# Patient Record
Sex: Male | Born: 1940 | Race: White | Hispanic: No | Marital: Married | Smoking: Never smoker
Health system: Southern US, Community
[De-identification: ages and names within clinical notes are randomized; demographics above are authoritative.]

## PROBLEM LIST (undated history)

## (undated) DIAGNOSIS — G4730 Sleep apnea, unspecified: Secondary | ICD-10-CM

## (undated) DIAGNOSIS — K219 Gastro-esophageal reflux disease without esophagitis: Secondary | ICD-10-CM

## (undated) HISTORY — PX: BACK SURGERY: SHX140

## (undated) HISTORY — PX: EYE SURGERY: SHX253

## (undated) HISTORY — PX: MOUTH SURGERY: SHX715

## (undated) HISTORY — PX: JOINT REPLACEMENT: SHX530

## (undated) HISTORY — PX: VASECTOMY: SHX75

## (undated) HISTORY — PX: HERNIA REPAIR: SHX51

## (undated) HISTORY — PX: TONSILLECTOMY: SUR1361

---

## 2014-05-12 ENCOUNTER — Other Ambulatory Visit: Payer: Self-pay | Admitting: Physician Assistant

## 2014-05-12 NOTE — H&P (Signed)
TOTAL KNEE ADMISSION H&P  Patient is being admitted for left total knee arthroplasty.  Subjective:  Chief Complaint:left knee pain.  HPI: Alan AbideJohn Frederick, 73 y.o. male, has a history of pain and functional disability in the left knee due to arthritis and has failed non-surgical conservative treatments for greater than 12 weeks to includeNSAID's and/or analgesics, corticosteriod injections and activity modification.  Onset of symptoms was gradual, starting >10 years ago with gradually worsening course since that time. The patient noted prior procedures on the knee to include  arthroscopy and menisectomy on the left knee(s).  Patient currently rates pain in the left knee(s) at 5 out of 10 with activity. Patient has night pain, worsening of pain with activity and weight bearing and joint swelling.  Patient has evidence of subchondral cysts, subchondral sclerosis and joint space narrowing by imaging studies. There is no active infection.  There are no active problems to display for this patient.  No past medical history on file.  No past surgical history on file.   (Not in a hospital admission) Allergies not on file  History  Substance Use Topics  . Smoking status: Not on file  . Smokeless tobacco: Not on file  . Alcohol Use: Not on file    No family history on file.   Review of Systems  Constitutional: Negative.   HENT: Negative.   Eyes: Negative.   Respiratory: Negative.   Cardiovascular: Negative.   Gastrointestinal: Negative.   Genitourinary: Positive for frequency. Negative for dysuria, urgency and hematuria.  Musculoskeletal: Positive for joint pain.  Skin: Negative.   Neurological: Negative.   Endo/Heme/Allergies: Negative.   Psychiatric/Behavioral: Negative.     Objective:  Physical Exam  Constitutional: He is oriented to person, place, and time. He appears well-developed and well-nourished.  HENT:  Head: Normocephalic and atraumatic.  Eyes: EOM are normal. Pupils are  equal, round, and reactive to light.  Neck: Normal range of motion. Neck supple.  Cardiovascular: Normal rate and regular rhythm.  Exam reveals no gallop and no friction rub.   No murmur heard. Respiratory: Effort normal. No respiratory distress. He has no wheezes. He has no rales.  GI: Soft. Bowel sounds are normal.  Musculoskeletal:  Examination of his left knee reveals range of motion from 0 to 110 degrees.  He does have an extension lag.  Moderate patellofemoral crepitus noted.  Some tenderness noted over the pes anserine bursa.  No joint line tenderness.  No swelling.  Positive degree of varus.  No positive straight leg raise.  Negative log roll.  Ligaments are stable.  He is neurovascularly intact distally.    Neurological: He is alert and oriented to person, place, and time.  Skin: Skin is warm and dry.  Psychiatric: He has a normal mood and affect. His behavior is normal. Judgment and thought content normal.    Vital signs in last 24 hours: @VSRANGES @  Labs:   There is no height or weight on file to calculate BMI.   Imaging Review Plain radiographs demonstrate severe degenerative joint disease of the left knee(s). The overall alignment ismild varus. The bone quality appears to be fair for age and reported activity level.  Assessment/Plan:  End stage arthritis, left knee   The patient history, physical examination, clinical judgment of the provider and imaging studies are consistent with end stage degenerative joint disease of the left knee(s) and total knee arthroplasty is deemed medically necessary. The treatment options including medical management, injection therapy arthroscopy and arthroplasty were discussed  at length. The risks and benefits of total knee arthroplasty were presented and reviewed. The risks due to aseptic loosening, infection, stiffness, patella tracking problems, thromboembolic complications and other imponderables were discussed. The patient acknowledged the  explanation, agreed to proceed with the plan and consent was signed. Patient is being admitted for inpatient treatment for surgery, pain control, PT, OT, prophylactic antibiotics, VTE prophylaxis, progressive ambulation and ADL's and discharge planning. The patient is planning to be discharged home with home health services   

## 2014-05-17 ENCOUNTER — Encounter (HOSPITAL_COMMUNITY): Payer: Self-pay | Admitting: Pharmacy Technician

## 2014-05-22 ENCOUNTER — Ambulatory Visit (HOSPITAL_COMMUNITY)
Admission: RE | Admit: 2014-05-22 | Discharge: 2014-05-22 | Disposition: A | Discharge status: Home / Self Care | Payer: Medicare Other | Source: Ambulatory Visit | Attending: Physician Assistant | Admitting: Physician Assistant

## 2014-05-22 ENCOUNTER — Encounter (HOSPITAL_COMMUNITY): Payer: Self-pay

## 2014-05-22 ENCOUNTER — Encounter (HOSPITAL_COMMUNITY)
Admission: RE | Admit: 2014-05-22 | Discharge: 2014-05-22 | Disposition: A | Discharge status: Home / Self Care | Payer: Medicare Other | Source: Ambulatory Visit | Attending: Orthopedic Surgery | Admitting: Orthopedic Surgery

## 2014-05-22 DIAGNOSIS — Z01818 Encounter for other preprocedural examination: Secondary | ICD-10-CM | POA: Diagnosis present

## 2014-05-22 DIAGNOSIS — M179 Osteoarthritis of knee, unspecified: Secondary | ICD-10-CM | POA: Diagnosis not present

## 2014-05-22 HISTORY — DX: Sleep apnea, unspecified: G47.30

## 2014-05-22 HISTORY — DX: Gastro-esophageal reflux disease without esophagitis: K21.9

## 2014-05-22 LAB — CBC WITH DIFFERENTIAL/PLATELET
BASOS ABS: 0.1 10*3/uL (ref 0.0–0.1)
BASOS PCT: 1 % (ref 0–1)
EOS ABS: 0.3 10*3/uL (ref 0.0–0.7)
Eosinophils Relative: 4 % (ref 0–5)
HCT: 45.1 % (ref 39.0–52.0)
HEMOGLOBIN: 15.8 g/dL (ref 13.0–17.0)
Lymphocytes Relative: 31 % (ref 12–46)
Lymphs Abs: 2.2 10*3/uL (ref 0.7–4.0)
MCH: 30.4 pg (ref 26.0–34.0)
MCHC: 35 g/dL (ref 30.0–36.0)
MCV: 86.9 fL (ref 78.0–100.0)
Monocytes Absolute: 0.7 10*3/uL (ref 0.1–1.0)
Monocytes Relative: 10 % (ref 3–12)
NEUTROS ABS: 4 10*3/uL (ref 1.7–7.7)
Neutrophils Relative %: 54 % (ref 43–77)
PLATELETS: 200 10*3/uL (ref 150–400)
RBC: 5.19 MIL/uL (ref 4.22–5.81)
RDW: 13.4 % (ref 11.5–15.5)
WBC: 7.3 10*3/uL (ref 4.0–10.5)

## 2014-05-22 LAB — SURGICAL PCR SCREEN
MRSA, PCR: NEGATIVE
Staphylococcus aureus: NEGATIVE

## 2014-05-22 LAB — URINALYSIS, ROUTINE W REFLEX MICROSCOPIC
Bilirubin Urine: NEGATIVE
Glucose, UA: NEGATIVE mg/dL
Hgb urine dipstick: NEGATIVE
Ketones, ur: NEGATIVE mg/dL
Leukocytes, UA: NEGATIVE
NITRITE: NEGATIVE
Protein, ur: NEGATIVE mg/dL
Specific Gravity, Urine: 1.023 (ref 1.005–1.030)
UROBILINOGEN UA: 0.2 mg/dL (ref 0.0–1.0)
pH: 6.5 (ref 5.0–8.0)

## 2014-05-22 LAB — PROTIME-INR
INR: 0.97 (ref 0.00–1.49)
Prothrombin Time: 13 seconds (ref 11.6–15.2)

## 2014-05-22 LAB — COMPREHENSIVE METABOLIC PANEL
ALK PHOS: 92 U/L (ref 39–117)
ALT: 27 U/L (ref 0–53)
AST: 23 U/L (ref 0–37)
Albumin: 3.7 g/dL (ref 3.5–5.2)
Anion gap: 11 (ref 5–15)
BILIRUBIN TOTAL: 1 mg/dL (ref 0.3–1.2)
BUN: 17 mg/dL (ref 6–23)
CHLORIDE: 100 meq/L (ref 96–112)
CO2: 28 mEq/L (ref 19–32)
Calcium: 9.5 mg/dL (ref 8.4–10.5)
Creatinine, Ser: 1.01 mg/dL (ref 0.50–1.35)
GFR calc Af Amer: 84 mL/min — ABNORMAL LOW (ref >90)
GFR calc non Af Amer: 73 mL/min — ABNORMAL LOW (ref >90)
Glucose, Bld: 93 mg/dL (ref 70–99)
POTASSIUM: 4.4 meq/L (ref 3.7–5.3)
SODIUM: 139 meq/L (ref 137–147)
TOTAL PROTEIN: 6.9 g/dL (ref 6.0–8.3)

## 2014-05-22 LAB — TYPE AND SCREEN
ABO/RH(D): A POS
ANTIBODY SCREEN: NEGATIVE

## 2014-05-22 LAB — APTT: aPTT: 31 seconds (ref 24–37)

## 2014-05-22 LAB — ABO/RH: ABO/RH(D): A POS

## 2014-05-22 NOTE — Pre-Procedure Instructions (Signed)
Alan Frederick  05/22/2014   Your procedure is scheduled on:  Wednesday, Oct. 28th   [facility]30 [facility].   Call this number if you have problems the morning of surgery: [phone]   Remember:   Do not eat food or drink liquids after midnight Tuesday.   Take these medicines the morning of surgery with A SIP OF WATER: NONE   Do not wear jewelry, no rings or watches.  Do not wear lotions or colognes.   You may NOT wear deodorant the morning of surgery.   Men may shave face and neck.   Do not bring valuables to the hospital.  [facility] is not responsible for any belongings or valuables.               Contacts, dentures or bridgework may not be worn into surgery.  Leave suitcase in the car. After surgery it may be brought to your room.  For patients admitted to the hospital, discharge time is determined by your treatment team.               Name and phone number of your driver:    Special Instructions: "Preparing for Surgery" instruction sheet.   Please read over the following fact sheets that you were given: Pain Booklet, Coughing and Deep Breathing, Blood Transfusion Information, MRSA Information and Surgical Site Infection Prevention

## 2014-05-22 NOTE — Progress Notes (Addendum)
Had sleep study done around 9 yrs ago at [facility] .  Uses the cpap, but doesn't know the settings.   Denies any cardiac history.  PCP is Dr. Paulino DoorMark Weiser @ [facility]   [phone]  (requested old ekg if done) Pt opted to watch Knee surgery video @ home.

## 2014-05-22 NOTE — Progress Notes (Signed)
Anesthesia Chart Review:  Pt is 73 year old male scheduled for L total knee replacement on 05/31/2014 with Dr. Jamison Neighbor. Murphy.   PMH: OSA, GERD  Medications include: ASA, vitamins  Preoperative labs reviewed.   Chest x-ray reviewed.  No active cardiopulmonary disease.  EKG: sinus bradycardia (57 bpm). Left axis deviation. Cannot rule out anterior infarct, age undetermined. Appears unchanged from previous EKG on chart dated 11/27/2011 from PCP office.   If no changes, I anticipate pt can proceed with surgery as scheduled.   Rica Mastngela Kabbe, FNP-BC [facility]/Anesthesiology Phone: [phone] 05/22/2014 3:36 PM

## 2014-05-30 MED ORDER — CLINDAMYCIN PHOSPHATE 900 MG/50ML IV SOLN
900.0000 mg | INTRAVENOUS | Status: AC
  Administered 2014-05-31: 900 mg via INTRAVENOUS
  Filled 2014-05-30: qty 50

## 2014-05-30 MED ORDER — LACTATED RINGERS IV SOLN: INTRAVENOUS | Status: DC

## 2014-05-31 ENCOUNTER — Inpatient Hospital Stay (HOSPITAL_COMMUNITY)
Admission: RE | Admit: 2014-05-31 | Discharge: 2014-06-01 | DRG: 470 | Disposition: A | Discharge status: Home Health | Payer: Medicare Other | Source: Ambulatory Visit | Attending: Orthopedic Surgery | Admitting: Orthopedic Surgery

## 2014-05-31 ENCOUNTER — Encounter (HOSPITAL_COMMUNITY): Payer: Medicare Other | Admitting: Emergency Medicine

## 2014-05-31 ENCOUNTER — Encounter (HOSPITAL_COMMUNITY): Payer: Self-pay | Admitting: *Deleted

## 2014-05-31 ENCOUNTER — Encounter (HOSPITAL_COMMUNITY)
Admission: RE | Disposition: A | Discharge status: Home Health | Payer: Self-pay | Source: Ambulatory Visit | Attending: Orthopedic Surgery

## 2014-05-31 ENCOUNTER — Inpatient Hospital Stay (HOSPITAL_COMMUNITY): Payer: Medicare Other | Admitting: Anesthesiology

## 2014-05-31 ENCOUNTER — Inpatient Hospital Stay (HOSPITAL_COMMUNITY): Payer: Medicare Other

## 2014-05-31 DIAGNOSIS — M1710 Unilateral primary osteoarthritis, unspecified knee: Secondary | ICD-10-CM | POA: Diagnosis present

## 2014-05-31 DIAGNOSIS — M1712 Unilateral primary osteoarthritis, left knee: Principal | ICD-10-CM | POA: Diagnosis present

## 2014-05-31 DIAGNOSIS — D62 Acute posthemorrhagic anemia: Secondary | ICD-10-CM | POA: Diagnosis not present

## 2014-05-31 DIAGNOSIS — G4730 Sleep apnea, unspecified: Secondary | ICD-10-CM | POA: Diagnosis present

## 2014-05-31 DIAGNOSIS — Z96659 Presence of unspecified artificial knee joint: Secondary | ICD-10-CM

## 2014-05-31 DIAGNOSIS — M179 Osteoarthritis of knee, unspecified: Secondary | ICD-10-CM | POA: Diagnosis present

## 2014-05-31 DIAGNOSIS — K219 Gastro-esophageal reflux disease without esophagitis: Secondary | ICD-10-CM | POA: Diagnosis present

## 2014-05-31 DIAGNOSIS — Z96652 Presence of left artificial knee joint: Secondary | ICD-10-CM

## 2014-05-31 HISTORY — PX: TOTAL KNEE ARTHROPLASTY: SHX125

## 2014-05-31 SURGERY — ARTHROPLASTY, KNEE, TOTAL
Anesthesia: General | Laterality: Left

## 2014-05-31 MED ORDER — SODIUM CHLORIDE 0.9 % IJ SOLN
INTRAMUSCULAR | Status: AC
  Filled 2014-05-31: qty 10

## 2014-05-31 MED ORDER — METOCLOPRAMIDE HCL 5 MG/ML IJ SOLN: 5.0000 mg | Freq: Three times a day (TID) | INTRAMUSCULAR | Status: DC | PRN

## 2014-05-31 MED ORDER — ONDANSETRON HCL 4 MG/2ML IJ SOLN
INTRAMUSCULAR | Status: AC
  Filled 2014-05-31: qty 2

## 2014-05-31 MED ORDER — OXYCODONE HCL 5 MG PO TABS
ORAL_TABLET | ORAL | Status: AC
  Filled 2014-05-31: qty 1

## 2014-05-31 MED ORDER — MIDAZOLAM HCL 5 MG/5ML IJ SOLN
INTRAMUSCULAR | Status: DC | PRN
  Administered 2014-05-31: 2 mg via INTRAVENOUS

## 2014-05-31 MED ORDER — HYDROMORPHONE HCL 1 MG/ML IJ SOLN
0.2500 mg | INTRAMUSCULAR | Status: DC | PRN
  Administered 2014-05-31: 0.5 mg via INTRAVENOUS

## 2014-05-31 MED ORDER — NEOSTIGMINE METHYLSULFATE 10 MG/10ML IV SOLN
INTRAVENOUS | Status: AC
  Filled 2014-05-31: qty 1

## 2014-05-31 MED ORDER — ALUM & MAG HYDROXIDE-SIMETH 200-200-20 MG/5ML PO SUSP: 30.0000 mL | ORAL | Status: DC | PRN

## 2014-05-31 MED ORDER — PHENYLEPHRINE 40 MCG/ML (10ML) SYRINGE FOR IV PUSH (FOR BLOOD PRESSURE SUPPORT)
PREFILLED_SYRINGE | INTRAVENOUS | Status: AC
  Filled 2014-05-31: qty 10

## 2014-05-31 MED ORDER — PHENOL 1.4 % MT LIQD: 1.0000 | OROMUCOSAL | Status: DC | PRN

## 2014-05-31 MED ORDER — SODIUM CHLORIDE 0.9 % IJ SOLN
INTRAMUSCULAR | Status: DC | PRN
  Administered 2014-05-31: 40 mL

## 2014-05-31 MED ORDER — FENTANYL CITRATE 0.05 MG/ML IJ SOLN
INTRAMUSCULAR | Status: AC
  Filled 2014-05-31: qty 5

## 2014-05-31 MED ORDER — DOCUSATE SODIUM 100 MG PO CAPS
100.0000 mg | ORAL_CAPSULE | Freq: Two times a day (BID) | ORAL | Status: DC
  Filled 2014-05-31 (×3): qty 1

## 2014-05-31 MED ORDER — BUPIVACAINE LIPOSOME 1.3 % IJ SUSP
20.0000 mL | Freq: Once | INTRAMUSCULAR | Status: DC
  Filled 2014-05-31: qty 20

## 2014-05-31 MED ORDER — ARTIFICIAL TEARS OP OINT
TOPICAL_OINTMENT | OPHTHALMIC | Status: DC | PRN
  Administered 2014-05-31: 1 via OPHTHALMIC

## 2014-05-31 MED ORDER — BISACODYL 5 MG PO TBEC: 5.0000 mg | DELAYED_RELEASE_TABLET | Freq: Every day | ORAL | Status: AC | PRN

## 2014-05-31 MED ORDER — HYDROMORPHONE HCL 1 MG/ML IJ SOLN: 0.5000 mg | INTRAMUSCULAR | Status: DC | PRN

## 2014-05-31 MED ORDER — CELECOXIB 200 MG PO CAPS
200.0000 mg | ORAL_CAPSULE | Freq: Two times a day (BID) | ORAL | Status: DC
  Administered 2014-05-31 – 2014-06-01 (×2): 200 mg via ORAL
  Filled 2014-05-31 (×3): qty 1

## 2014-05-31 MED ORDER — EPHEDRINE SULFATE 50 MG/ML IJ SOLN
INTRAMUSCULAR | Status: AC
  Filled 2014-05-31: qty 1

## 2014-05-31 MED ORDER — METHOCARBAMOL 1000 MG/10ML IJ SOLN
500.0000 mg | Freq: Four times a day (QID) | INTRAVENOUS | Status: DC | PRN
  Filled 2014-05-31: qty 5

## 2014-05-31 MED ORDER — METHOCARBAMOL 500 MG PO TABS
500.0000 mg | ORAL_TABLET | Freq: Four times a day (QID) | ORAL | Status: DC | PRN
  Administered 2014-05-31 – 2014-06-01 (×3): 500 mg via ORAL
  Filled 2014-05-31 (×2): qty 1

## 2014-05-31 MED ORDER — POTASSIUM CHLORIDE IN NACL 20-0.9 MEQ/L-% IV SOLN
INTRAVENOUS | Status: DC
  Filled 2014-05-31 (×4): qty 1000

## 2014-05-31 MED ORDER — GLYCOPYRROLATE 0.2 MG/ML IJ SOLN
INTRAMUSCULAR | Status: AC
  Filled 2014-05-31: qty 2

## 2014-05-31 MED ORDER — DIPHENHYDRAMINE HCL 12.5 MG/5ML PO ELIX
12.5000 mg | ORAL_SOLUTION | ORAL | Status: DC | PRN
Start: 2014-05-31 — End: 2014-06-01

## 2014-05-31 MED ORDER — PROPOFOL 10 MG/ML IV BOLUS
INTRAVENOUS | Status: DC | PRN
  Administered 2014-05-31: 150 mg via INTRAVENOUS

## 2014-05-31 MED ORDER — MIDAZOLAM HCL 2 MG/2ML IJ SOLN
INTRAMUSCULAR | Status: AC
  Filled 2014-05-31: qty 2

## 2014-05-31 MED ORDER — ACETAMINOPHEN 650 MG RE SUPP: 650.0000 mg | Freq: Four times a day (QID) | RECTAL | Status: DC | PRN

## 2014-05-31 MED ORDER — LACTATED RINGERS IV SOLN
INTRAVENOUS | Status: DC | PRN
  Administered 2014-05-31 (×2): via INTRAVENOUS

## 2014-05-31 MED ORDER — EPHEDRINE SULFATE 50 MG/ML IJ SOLN
INTRAMUSCULAR | Status: DC | PRN
  Administered 2014-05-31: 5 mg via INTRAVENOUS

## 2014-05-31 MED ORDER — CHLORHEXIDINE GLUCONATE 4 % EX LIQD
60.0000 mL | Freq: Once | CUTANEOUS | Status: DC
  Filled 2014-05-31: qty 60

## 2014-05-31 MED ORDER — BUPIVACAINE HCL (PF) 0.25 % IJ SOLN
INTRAMUSCULAR | Status: DC | PRN
  Administered 2014-05-31: 10 mL

## 2014-05-31 MED ORDER — MENTHOL 3 MG MT LOZG: 1.0000 | LOZENGE | OROMUCOSAL | Status: DC | PRN

## 2014-05-31 MED ORDER — OXYCODONE HCL 5 MG PO TABS
5.0000 mg | ORAL_TABLET | ORAL | Status: DC | PRN
  Administered 2014-05-31 – 2014-06-01 (×4): 10 mg via ORAL
  Filled 2014-05-31 (×4): qty 2

## 2014-05-31 MED ORDER — ASPIRIN EC 325 MG PO TBEC: 325.0000 mg | DELAYED_RELEASE_TABLET | Freq: Every day | ORAL | Status: AC

## 2014-05-31 MED ORDER — 0.9 % SODIUM CHLORIDE (POUR BTL) OPTIME
TOPICAL | Status: DC | PRN
  Administered 2014-05-31: 1000 mL

## 2014-05-31 MED ORDER — ACETAMINOPHEN 325 MG PO TABS: 650.0000 mg | ORAL_TABLET | Freq: Four times a day (QID) | ORAL | Status: DC | PRN

## 2014-05-31 MED ORDER — ONDANSETRON HCL 4 MG PO TABS: 4.0000 mg | ORAL_TABLET | Freq: Four times a day (QID) | ORAL | Status: DC | PRN

## 2014-05-31 MED ORDER — PROPOFOL 10 MG/ML IV BOLUS
INTRAVENOUS | Status: AC
  Filled 2014-05-31: qty 20

## 2014-05-31 MED ORDER — ONDANSETRON HCL 4 MG PO TABS: 4.0000 mg | ORAL_TABLET | Freq: Three times a day (TID) | ORAL | Status: AC | PRN

## 2014-05-31 MED ORDER — BUPIVACAINE HCL (PF) 0.25 % IJ SOLN
INTRAMUSCULAR | Status: AC
  Filled 2014-05-31: qty 30

## 2014-05-31 MED ORDER — ASPIRIN EC 325 MG PO TBEC
325.0000 mg | DELAYED_RELEASE_TABLET | Freq: Every day | ORAL | Status: DC
  Administered 2014-06-01: 325 mg via ORAL
  Filled 2014-05-31 (×2): qty 1

## 2014-05-31 MED ORDER — HYDROMORPHONE HCL 1 MG/ML IJ SOLN
INTRAMUSCULAR | Status: AC
  Filled 2014-05-31: qty 1

## 2014-05-31 MED ORDER — ONDANSETRON HCL 4 MG/2ML IJ SOLN: 4.0000 mg | Freq: Four times a day (QID) | INTRAMUSCULAR | Status: DC | PRN

## 2014-05-31 MED ORDER — CLINDAMYCIN PHOSPHATE 600 MG/50ML IV SOLN
600.0000 mg | Freq: Four times a day (QID) | INTRAVENOUS | Status: AC
  Administered 2014-05-31: 600 mg via INTRAVENOUS
  Filled 2014-05-31 (×2): qty 50

## 2014-05-31 MED ORDER — ZOLPIDEM TARTRATE 5 MG PO TABS: 5.0000 mg | ORAL_TABLET | Freq: Every evening | ORAL | Status: DC | PRN

## 2014-05-31 MED ORDER — BUPIVACAINE LIPOSOME 1.3 % IJ SUSP
INTRAMUSCULAR | Status: DC | PRN
  Administered 2014-05-31: 20 mL

## 2014-05-31 MED ORDER — METOCLOPRAMIDE HCL 10 MG PO TABS: 5.0000 mg | ORAL_TABLET | Freq: Three times a day (TID) | ORAL | Status: DC | PRN

## 2014-05-31 MED ORDER — OXYCODONE HCL 5 MG/5ML PO SOLN: 5.0000 mg | Freq: Once | ORAL | Status: AC | PRN

## 2014-05-31 MED ORDER — LIDOCAINE HCL (CARDIAC) 20 MG/ML IV SOLN
INTRAVENOUS | Status: DC | PRN
  Administered 2014-05-31: 50 mg via INTRAVENOUS

## 2014-05-31 MED ORDER — ONDANSETRON HCL 4 MG/2ML IJ SOLN
INTRAMUSCULAR | Status: DC | PRN
Start: 2014-05-31 — End: 2014-05-31
  Administered 2014-05-31: 4 mg via INTRAVENOUS

## 2014-05-31 MED ORDER — SUCCINYLCHOLINE CHLORIDE 20 MG/ML IJ SOLN
INTRAMUSCULAR | Status: AC
  Filled 2014-05-31: qty 1

## 2014-05-31 MED ORDER — SODIUM CHLORIDE 0.9 % IR SOLN
Status: DC | PRN
  Administered 2014-05-31 (×2): 1000 mL

## 2014-05-31 MED ORDER — DEXAMETHASONE SODIUM PHOSPHATE 4 MG/ML IJ SOLN
INTRAMUSCULAR | Status: DC | PRN
  Administered 2014-05-31: 4 mg via INTRAVENOUS

## 2014-05-31 MED ORDER — FENTANYL CITRATE 0.05 MG/ML IJ SOLN
INTRAMUSCULAR | Status: DC | PRN
  Administered 2014-05-31: 25 ug via INTRAVENOUS
  Administered 2014-05-31: 250 ug via INTRAVENOUS
  Administered 2014-05-31 (×2): 25 ug via INTRAVENOUS

## 2014-05-31 MED ORDER — OXYCODONE-ACETAMINOPHEN 5-325 MG PO TABS: 1.0000 | ORAL_TABLET | ORAL | Status: AC | PRN

## 2014-05-31 MED ORDER — ARTIFICIAL TEARS OP OINT
TOPICAL_OINTMENT | OPHTHALMIC | Status: AC
  Filled 2014-05-31: qty 3.5

## 2014-05-31 MED ORDER — METHOCARBAMOL 500 MG PO TABS
ORAL_TABLET | ORAL | Status: AC
  Filled 2014-05-31: qty 1

## 2014-05-31 MED ORDER — ROCURONIUM BROMIDE 50 MG/5ML IV SOLN
INTRAVENOUS | Status: AC
  Filled 2014-05-31: qty 1

## 2014-05-31 MED ORDER — OXYCODONE HCL 5 MG PO TABS
5.0000 mg | ORAL_TABLET | Freq: Once | ORAL | Status: AC | PRN
  Administered 2014-05-31: 5 mg via ORAL

## 2014-05-31 SURGICAL SUPPLY — 64 items
BANDAGE ELASTIC 4 VELCRO ST LF (GAUZE/BANDAGES/DRESSINGS) ×3 IMPLANT
BANDAGE ELASTIC 6 VELCRO ST LF (GAUZE/BANDAGES/DRESSINGS) ×3 IMPLANT
BANDAGE ESMARK 6X9 LF (GAUZE/BANDAGES/DRESSINGS) ×1 IMPLANT
BENZOIN TINCTURE PRP APPL 2/3 (GAUZE/BANDAGES/DRESSINGS) ×3 IMPLANT
BLADE SAG 18X100X1.27 (BLADE) ×6 IMPLANT
BNDG ESMARK 6X9 LF (GAUZE/BANDAGES/DRESSINGS) ×3
BOWL SMART MIX CTS (DISPOSABLE) ×3 IMPLANT
CEMENT BONE SIMPLEX SPEEDSET (Cement) ×6 IMPLANT
CLOSURE STERI-STRIP 1/2X4 (GAUZE/BANDAGES/DRESSINGS) ×1
CLOSURE WOUND 1/2 X4 (GAUZE/BANDAGES/DRESSINGS) ×2
CLSR STERI-STRIP ANTIMIC 1/2X4 (GAUZE/BANDAGES/DRESSINGS) ×2 IMPLANT
COVER SURGICAL LIGHT HANDLE (MISCELLANEOUS) ×3 IMPLANT
CUFF TOURNIQUET SINGLE 34IN LL (TOURNIQUET CUFF) ×3 IMPLANT
DRAPE EXTREMITY T 121X128X90 (DRAPE) ×3 IMPLANT
DRAPE PROXIMA HALF (DRAPES) ×3 IMPLANT
DRAPE U-SHAPE 47X51 STRL (DRAPES) ×3 IMPLANT
DRSG PAD ABDOMINAL 8X10 ST (GAUZE/BANDAGES/DRESSINGS) ×3 IMPLANT
DURAPREP 26ML APPLICATOR (WOUND CARE) ×6 IMPLANT
ELECT CAUTERY BLADE 6.4 (BLADE) ×3 IMPLANT
ELECT REM PT RETURN 9FT ADLT (ELECTROSURGICAL) ×3
ELECTRODE REM PT RTRN 9FT ADLT (ELECTROSURGICAL) ×1 IMPLANT
EVACUATOR 1/8 PVC DRAIN (DRAIN) ×3 IMPLANT
FACESHIELD WRAPAROUND (MASK) ×6 IMPLANT
GAUZE SPONGE 4X4 12PLY STRL (GAUZE/BANDAGES/DRESSINGS) ×3 IMPLANT
GLOVE BIOGEL PI IND STRL 7.0 (GLOVE) ×2 IMPLANT
GLOVE BIOGEL PI INDICATOR 7.0 (GLOVE) ×4
GLOVE ECLIPSE 6.5 STRL STRAW (GLOVE) ×6 IMPLANT
GLOVE ORTHO TXT STRL SZ7.5 (GLOVE) ×3 IMPLANT
GOWN STRL REUS W/ TWL LRG LVL3 (GOWN DISPOSABLE) ×1 IMPLANT
GOWN STRL REUS W/ TWL XL LVL3 (GOWN DISPOSABLE) ×1 IMPLANT
GOWN STRL REUS W/TWL LRG LVL3 (GOWN DISPOSABLE) ×2
GOWN STRL REUS W/TWL XL LVL3 (GOWN DISPOSABLE) ×2
HANDPIECE INTERPULSE COAX TIP (DISPOSABLE) ×2
IMMOBILIZER KNEE 22 UNIV (SOFTGOODS) ×3 IMPLANT
IMMOBILIZER KNEE 24 THIGH 36 (MISCELLANEOUS) IMPLANT
IMMOBILIZER KNEE 24 UNIV (MISCELLANEOUS)
KIT BASIN OR (CUSTOM PROCEDURE TRAY) ×3 IMPLANT
KIT ROOM TURNOVER OR (KITS) ×3 IMPLANT
KNEE/VIT E POLY LINER LEVEL 1B ×3 IMPLANT
MANIFOLD NEPTUNE II (INSTRUMENTS) ×3 IMPLANT
NEEDLE 18GX1X1/2 (RX/OR ONLY) (NEEDLE) ×3 IMPLANT
NEEDLE 25GX 5/8IN NON SAFETY (NEEDLE) ×3 IMPLANT
NS IRRIG 1000ML POUR BTL (IV SOLUTION) ×3 IMPLANT
PACK TOTAL JOINT (CUSTOM PROCEDURE TRAY) ×3 IMPLANT
PAD ARMBOARD 7.5X6 YLW CONV (MISCELLANEOUS) ×6 IMPLANT
PAD CAST 4YDX4 CTTN HI CHSV (CAST SUPPLIES) ×1 IMPLANT
PADDING CAST COTTON 4X4 STRL (CAST SUPPLIES) ×2
PADDING CAST COTTON 6X4 STRL (CAST SUPPLIES) ×3 IMPLANT
SET HNDPC FAN SPRY TIP SCT (DISPOSABLE) ×1 IMPLANT
SPONGE GAUZE 4X4 12PLY STER LF (GAUZE/BANDAGES/DRESSINGS) ×3 IMPLANT
STRIP CLOSURE SKIN 1/2X4 (GAUZE/BANDAGES/DRESSINGS) ×4 IMPLANT
SUCTION FRAZIER TIP 10 FR DISP (SUCTIONS) ×3 IMPLANT
SUT MNCRL AB 4-0 PS2 18 (SUTURE) ×3 IMPLANT
SUT VIC AB 0 CT1 27 (SUTURE) ×4
SUT VIC AB 0 CT1 27XBRD ANBCTR (SUTURE) ×2 IMPLANT
SUT VIC AB 1 CT1 27 (SUTURE) ×4
SUT VIC AB 1 CT1 27XBRD ANBCTR (SUTURE) ×2 IMPLANT
SUT VIC AB 2-0 CT1 27 (SUTURE) ×4
SUT VIC AB 2-0 CT1 TAPERPNT 27 (SUTURE) ×2 IMPLANT
SYR 50ML LL SCALE MARK (SYRINGE) ×3 IMPLANT
SYR CONTROL 10ML LL (SYRINGE) ×3 IMPLANT
TOWEL OR 17X24 6PK STRL BLUE (TOWEL DISPOSABLE) ×3 IMPLANT
TOWEL OR 17X26 10 PK STRL BLUE (TOWEL DISPOSABLE) ×3 IMPLANT
WATER STERILE IRR 1000ML POUR (IV SOLUTION) IMPLANT

## 2014-05-31 NOTE — Op Note (Signed)
NAME:  Alan RouxHOWARD, Frederick                 ACCOUNT NO.:  [id number]  MEDICAL RECORD NO.:  [id number]  LOCATION:  5N10C                        FACILITY:  [facility]  PHYSICIAN:  Alan Frederick, M.D. DATE OF BIRTH:  Dec 10, 1940  DATE OF PROCEDURE:  05/31/2014 DATE OF DISCHARGE:                              OPERATIVE REPORT   PREOPERATIVE DIAGNOSES:  Left knee end-stage arthritis, secondary, erosive.  Varus alignment, mild flexion contracture.  Bone loss with intraosseous cyst, distal medial femur.  POSTOPERATIVE DIAGNOSES:  Left knee end-stage arthritis, secondary, erosive.  Varus alignment, mild flexion contracture.  Bone loss with intraosseous cyst, distal medial femur.  PROCEDURE:  Modified minimally invasive left total knee replacement, Stryker Triathlon prosthesis.  Soft tissue balancing.  Cemented pegged posterior stabilized #6 femoral component.  Cemented #7 tibial component, 9-mm polyethylene insert.  Cemented resurfacing 38-mm patellar component.  Autologous bone graft from tibial cuttings to graft the erosive cyst within the distal medial femur.  SURGEON:  Alan Frederick, M.D.  ASSISTANT:  Alan GageLindsey Anton, PA, present throughout the entire case and necessary for timely completion of procedure.  ANESTHESIA:  General.  BLOOD LOSS:  Minimal.  SPECIMENS:  None.  CULTURES:  None.  COMPLICATIONS:  None.  DRESSINGS:  Soft compressive knee immobilizer.  DRAINS:  Hemovac x1.  TOURNIQUET TIME:  50 minutes.  DESCRIPTION OF PROCEDURE:  The patient was brought to the operating room, placed on the operating table in supine position.  After adequate anesthesia had been obtained, tourniquet applied, prepped and draped in usual sterile fashion.  Exsanguinated with elevation of Esmarch. Tourniquet inflated to 350 mmHg.  Straight incision above the patella and down to tibial tubercle.  Medial arthrotomy, vastus splitting, preserving quad tendon.  Medial capsule release.  Exuberant  spurs excised throughout.  Previous open medial meniscectomy done 30 years ago.  Significant erosive changes especially medial compartment.  An 8- mm resection of distal femur, 5 degrees of valgus utilizing the intramedullary flexible rod.  Using epicondylar axis, the femur was sized, cut, and fitted for a pegged #6 component.  A 1-2 cm cyst within the distal femur was curetted out to good bone.  I subsequently used to cutting from the tibia, to packing the cancellous bone.  This was a contained lesion.  Proximal tibia exposed.  Extramedullary guide.  5- degree posterior slope cut.  Debris cleared throughout.  Nicely balanced in flexion and extension.  Patella exposed.  Posterior 10 mm removed. Drilled, sized, and fitted for a 38-mm component.  Trials were put in place.  With the 9-mm insert, I was very pleased with biomechanical axis, full motion, good stability, good alignment.  Full extension. Good patellar tracking.  Tibia was marked for rotation and hand reamed. All trials were removed.  Copious irrigation with a pulse irrigating device.  Cement prepared, placed on all components, and firmly seated. Polyethylene attached to the tibia and knee reduced.  Patella held with a clamp.  Once the cement hardened, the knee was irrigated once again. Soft tissue was injected with Exparel.  Hemovac was placed.  Arthrotomy was closed with #1 Vicryl with subcutaneous and subcuticular closure. Margins were injected with Marcaine.  Sterile compressive  dressing was applied.  Tourniquet was deflated and removed.  Knee immobilizer was applied.  Anesthesia was reversed.  Brought to the recovery room. Tolerated the surgery well.  No complications.     Alan Frederick, M.D.     DFM/MEDQ  D:  05/31/2014  T:  05/31/2014  Job:  [id number]

## 2014-05-31 NOTE — Evaluation (Signed)
Physical Therapy Evaluation Patient Details Name: Alan AbideJohn Schoening MRN: [id number] DOB: 05/24/1941 Today's Date: 05/31/2014   History of Present Illness  pt is a 73 y.o. male s/p Lt TKA, 05/31/14.  Clinical Impression  Pt is s/p Lt TKA POD#0 resulting in the deficits listed below (see PT Problem List).  Pt will benefit from skilled PT to increase their independence and safety with mobility to allow discharge to the venue listed below. Evaluation limited to sitting EOB due to diaphoretic episode and unresponsiveness; pt began responding immediately and appropriately when returned to supine. RN made aware.      Follow Up Recommendations Home health PT;Supervision/Assistance - 24 hour    Equipment Recommendations  Rolling walker with 5" wheels;3in1 (PT)    Recommendations for Other Services OT consult     Precautions / Restrictions Precautions Precautions: Fall;Knee Precaution Comments: educated on no pillow under knee  Required Braces or Orthoses: Knee Immobilizer - Left Knee Immobilizer - Left: On when out of bed or walking Restrictions Weight Bearing Restrictions: Yes LLE Weight Bearing: Weight bearing as tolerated      Mobility  Bed Mobility Overal bed mobility: Needs Assistance Bed Mobility: Supine to Sit;Sit to Supine     Supine to sit: Min assist;HOB elevated Sit to supine: Max assist (due to unresponsive episode )   General bed mobility comments: cues for sequencing and min (A) to advance Lt LE off EOB and control to ground; pt required max (A) to return to supine quickly due to unresponsive/diaphoretic episode   Transfers                 General transfer comment: unable to safely address today due to dizziness, diaphoretic/non responsive episode  Ambulation/Gait             General Gait Details: unable to safely address today due to dizziness, diaphoretic/non responsive episode  Stairs            Wheelchair Mobility    Modified Rankin (Stroke  Patients Only)       Balance Overall balance assessment: Needs assistance Sitting-balance support: Feet supported;No upper extremity supported Sitting balance-Leahy Scale: Fair Sitting balance - Comments: sat EOB 2-3 min and became dizzy/unresponsive; requiring return to supine                                      Pertinent Vitals/Pain Pain Assessment: 0-10 Pain Score: 3  Pain Location: Lt knee Pain Descriptors / Indicators: Aching Pain Intervention(s): Monitored during session;Premedicated before session;Repositioned    Home Living Family/patient expects to be discharged to:: Private residence Living Arrangements: Spouse/significant other Available Help at Discharge: Family;Available 24 hours/day Type of Home: House Home Access: Stairs to enter Entrance Stairs-Rails: None Entrance Stairs-Number of Steps: 2 Home Layout: One level Home Equipment: None Additional Comments: pt has walk in shower    Prior Function Level of Independence: Independent               Hand Dominance        Extremity/Trunk Assessment   Upper Extremity Assessment: Defer to OT evaluation           Lower Extremity Assessment: LLE deficits/detail   LLE Deficits / Details: quad 2+/5; hip 2+/5  Cervical / Trunk Assessment: Normal  Communication   Communication: No difficulties  Cognition Arousal/Alertness: Awake/alert Behavior During Therapy: Anxious Overall Cognitive Status: Within Functional Limits for tasks assessed  General Comments General comments (skin integrity, edema, etc.): discussed D/C goals and disposition with pt and wife; encouraged OOB later tonight for dinner with nursing if tolerated     Exercises Total Joint Exercises Ankle Circles/Pumps: AROM;Both;10 reps;Supine      Assessment/Plan    PT Assessment Patient needs continued PT services  PT Diagnosis Difficulty walking;Generalized weakness;Acute pain   PT  Problem List Decreased strength;Decreased range of motion;Decreased activity tolerance;Decreased balance;Decreased mobility;Decreased knowledge of use of DME;Decreased knowledge of precautions;Pain;Impaired sensation  PT Treatment Interventions DME instruction;Gait training;Stair training;Functional mobility training;Therapeutic activities;Therapeutic exercise;Balance training;Neuromuscular re-education;Patient/family education   PT Goals (Current goals can be found in the Care Plan section) Acute Rehab PT Goals Patient Stated Goal: home by friday PT Goal Formulation: With patient Time For Goal Achievement: 06/04/14 Potential to Achieve Goals: Good    Frequency 7X/week   Barriers to discharge        Co-evaluation               End of Session Equipment Utilized During Treatment: Gait belt;Left knee immobilizer Activity Tolerance: Other (comment) (limited by dizziness) Patient left: in bed;with call bell/phone within reach;with family/visitor present Nurse Communication: Mobility status;Other (comment) (episode during session)         Time: [id number] PT Time Calculation (min): 17 min   Charges:   PT Evaluation $Initial PT Evaluation Tier I: 1 Procedure PT Treatments $Therapeutic Activity: 8-22 mins   PT G CodesDonell Sievert:          Bettyanne Dittman N ,[place]  [phone] 05/31/2014, 4:47 PM

## 2014-05-31 NOTE — Discharge Summary (Addendum)
Patient ID: Alan Frederick MRN: [id number] DOB/AGE: 71/12/1940 71 y.o.  Admit date: 05/31/2014 Discharge date: 06/01/2014  Admission Diagnoses:  Active Problems:   DJD (degenerative joint disease) of knee   Discharge Diagnoses:  Same  Past Medical History  Diagnosis Date  . Sleep apnea     tested 9 yrs ago [facility] @ [facility]  . GERD (gastroesophageal reflux disease)     once in awhile...takes no meds    Surgeries: Procedure(s): LEFT TOTAL KNEE ARTHROPLASTY on 05/31/2014   Consultants:    Discharged Condition: Improved  Hospital Course: Alan Frederick is an 73 y.o. male who was admitted 05/31/2014 for operative treatment of left knee primary osteoarthritis. Patient has severe unremitting pain that affects sleep, daily activities, and work/hobbies. After pre-op clearance the patient was taken to the operating room on 05/31/2014 and underwent  Procedure(s): LEFT TOTAL KNEE ARTHROPLASTY.  Patient had a pre-op hgb of 15.8.  He developed ABLA on POD #1 with a hgb of 12.7.  He has some lightheadedness when sitting up in bed but states this has gradually been improving.  We will continue to follow this.  Patient was given perioperative antibiotics:     Anti-infectives   Start     Dose/Rate Route Frequency Ordered Stop   05/31/14 1630  clindamycin (CLEOCIN) IVPB 600 mg     600 mg 100 mL/hr over 30 Minutes Intravenous Every 6 hours 05/31/14 1554 06/01/14 0429   05/31/14 0600  clindamycin (CLEOCIN) IVPB 900 mg     900 mg 100 mL/hr over 30 Minutes Intravenous On call to O.R. 05/30/14 1420 05/31/14 0847       Patient was given sequential compression devices, early ambulation, and chemoprophylaxis to prevent DVT.  Patient benefited maximally from hospital stay and there were no complications.    Recent vital signs:  Patient Vitals for the past 24 hrs:  BP Temp Temp src Pulse Resp SpO2 Weight  06/01/14 0419 122/68 mmHg 98 F (36.7 C) - 79 15 96 % -  06/01/14 0155 115/66 mmHg  98.7 F (37.1 C) - 82 15 96 % -  05/31/14 2310 - - - 81 15 99 % -  05/31/14 2038 119/70 mmHg 98.3 F (36.8 C) - 109 18 97 % -  05/31/14 1327 135/78 mmHg 97.6 F (36.4 C) Oral 88 - 100 % -  05/31/14 1300 139/82 mmHg 97.4 F (36.3 C) - 88 18 100 % -  05/31/14 1245 138/72 mmHg - - 86 12 100 % -  05/31/14 1230 127/78 mmHg - - 80 12 98 % -  05/31/14 1215 138/80 mmHg - - 84 12 100 % -  05/31/14 1200 141/81 mmHg - - 81 14 100 % -  05/31/14 1145 150/86 mmHg - - 92 21 100 % -  05/31/14 1135 - - - 82 18 100 % -  05/31/14 1129 137/84 mmHg - - 87 17 100 % -  05/31/14 1115 - - - 83 19 100 % -  05/31/14 1114 143/83 mmHg - - 83 10 100 % -  05/31/14 1059 133/86 mmHg - - 78 8 99 % -  05/31/14 1046 - - - 86 15 100 % -  05/31/14 1045 134/80 mmHg 98 F (36.7 C) - 85 16 100 % -  05/31/14 0652 147/78 mmHg 97.1 F (36.2 C) Oral 65 18 100 % 77.701 kg (171 lb 4.8 oz)     Recent laboratory studies:   Recent Labs  06/01/14 0525  WBC 12.9*  HGB  12.7*  HCT 37.3*  PLT 169     Discharge Medications:     Medication List         aspirin EC 325 MG tablet  Take 1 tablet (325 mg total) by mouth daily.     bisacodyl 5 MG EC tablet  Commonly known as:  DULCOLAX  Take 1 tablet (5 mg total) by mouth daily as needed for moderate constipation.     MULTIVITAMIN PO  Take 1 tablet by mouth daily.     ondansetron 4 MG tablet  Commonly known as:  ZOFRAN  Take 1 tablet (4 mg total) by mouth every 8 (eight) hours as needed for nausea or vomiting.     oxyCODONE-acetaminophen 5-325 MG per tablet  Commonly known as:  ROXICET  Take 1-2 tablets by mouth every 4 (four) hours as needed.        Diagnostic Studies: Dg Chest 2 View  05/22/2014   CLINICAL DATA:  Preoperative for left total knee arthroplasty.  EXAM: CHEST  2 VIEW  COMPARISON:  None.  FINDINGS: The heart size and mediastinal contours are within normal limits. There is no focal infiltrate, pulmonary edema, or pleural effusion. The visualized  skeletal structures are unremarkable.  IMPRESSION: No active cardiopulmonary disease.   Electronically Signed   By: Sherian Rein M.D.   On: 05/22/2014 14:05    Disposition: Final discharge disposition not confirmed  Discharge Instructions   CPM    Complete by:  As directed   Continuous passive motion machine (CPM):      Use the CPM from 0- to 60 for 6 hours per day.      You may increase by 10 per day.  You may break it up into 2 or 3 sessions per day.      Use CPM for 2-3 weeks or until you are told to stop.     Call MD / Call 911    Complete by:  As directed   If you experience chest pain or shortness of breath, CALL 911 and be transported to the hospital emergency room.  If you develope a fever above 101 F, pus (white drainage) or increased drainage or redness at the wound, or calf pain, call your surgeon's office.     Change dressing    Complete by:  As directed   Change dressing on Saturday, then change the dressing daily with sterile 4 x 4 inch gauze dressing and apply TED hose.  You may clean the incision with alcohol prior to redressing.     Constipation Prevention    Complete by:  As directed   Drink plenty of fluids.  Prune juice may be helpful.  You may use a stool softener, such as Colace (over the counter) 100 mg twice a day.  Use MiraLax (over the counter) for constipation as needed.     Diet - low sodium heart healthy    Complete by:  As directed      Discharge instructions    Complete by:  As directed   Weight bearing as tolerated.  Take Aspirin 325 mg 1 tab a day for the next 30 days to prevent blood clots.  Change dressing daily starting on Saturday.  May shower on Monday, but do not soak incision.  May apply ice for up to 20 minutes at a time for pain and swelling.  Follow up appointment in two weeks.     Do not put a pillow under the knee. Place it under  the heel.    Complete by:  As directed   Place gray foam under operative heel when in bed or in a chair to work on  extension     Increase activity slowly as tolerated    Complete by:  As directed      TED hose    Complete by:  As directed   Use stockings (TED hose) for 2 weeks on both leg(s).  You may remove them at night for sleeping.           Follow-up Information   [facility]. Schedule an appointment as soon as possible for a visit in 2 weeks.   Specialty:  Orthopedic Surgery   Contact information:   [address] [place] [address] [phone]        Signed: Gearldine ShownNTON, M. LINDSEY 06/01/2014, 6:43 AM

## 2014-05-31 NOTE — Progress Notes (Signed)
Orthopedic Tech Progress Note Patient Details:  Atlee AbideJohn Spear 12/11/1940 [id number] Applied CPM to LLE.  Applied OHF with trapeze to pt.'s bed.  Left Bone Foam with pt.'s nurse. CPM Left Knee CPM Left Knee: On Left Knee Flexion (Degrees): 90 Left Knee Extension (Degrees): 0   Lesle ChrisGilliland, Gunnar Hereford L 05/31/2014, 11:23 AM

## 2014-05-31 NOTE — Discharge Instructions (Signed)
Total Knee Replacement, Care After °Refer to this sheet in the next few weeks. These instructions provide you with information on caring for yourself after your procedure. Your health care provider also may give you specific instructions. Your treatment has been planned according to the most current medical practices, but problems sometimes occur. Call your health care provider if you have any problems or questions after your procedure. °HOME CARE INSTRUCTIONS  ° °Weight bearing as tolerated.  Take Aspirin 325 mg 1 tab a day for the next 30 days to prevent blood clots.  Change bandages daily starting on Saturday.  May shower on Monday, but do not soak incision.  May apply ice for up to 20 minutes at a time for pain and swelling.  Follow up appointment in two weeks.  ° °· See a physical therapist as directed by your health care provider. °· Take medicines only as directed by your health care provider. °· Avoid lifting or driving until you are instructed otherwise. °· If you have been sent home with a continuous passive motion machine, use it as directed by your health care provider. °SEEK MEDICAL CARE IF: °· You have difficulty breathing. °· You have drainage, redness, swelling, or pain at your incision site. °· You have a bad smell coming from your incision site. °· You have persistent bleeding from your incision site. °· Your incision breaks open after sutures (stitches) or staples have been removed. °· You have a fever. °SEEK IMMEDIATE MEDICAL CARE IF:  °· You have a rash. °· You have pain or swelling in your calf or thigh. °· You have shortness of breath or chest pain. °· Your range of motion in your knee is decreasing rather than increasing. °MAKE SURE YOU:  °· Understand these instructions. °· Will watch your condition. °· Will get help right away if you are not doing well or get worse. °Document Released: 02/07/2005 Document Revised: 12/05/2013 Document Reviewed: 09/09/2011 °[employer]® Patient Information ©2015  [employer]. This information is not intended to replace advice given to you by your health care provider. Make sure you discuss any questions you have with your health care provider. ° °

## 2014-05-31 NOTE — H&P (View-Only) (Signed)
TOTAL KNEE ADMISSION H&P  Patient is being admitted for left total knee arthroplasty.  Subjective:  Chief Complaint:left knee pain.  HPI: Alan Frederick, 73 y.o. male, has a history of pain and functional disability in the left knee due to arthritis and has failed non-surgical conservative treatments for greater than 12 weeks to includeNSAID's and/or analgesics, corticosteriod injections and activity modification.  Onset of symptoms was gradual, starting >10 years ago with gradually worsening course since that time. The patient noted prior procedures on the knee to include  arthroscopy and menisectomy on the left knee(s).  Patient currently rates pain in the left knee(s) at 5 out of 10 with activity. Patient has night pain, worsening of pain with activity and weight bearing and joint swelling.  Patient has evidence of subchondral cysts, subchondral sclerosis and joint space narrowing by imaging studies. There is no active infection.  There are no active problems to display for this patient.  No past medical history on file.  No past surgical history on file.   (Not in a hospital admission) Allergies not on file  History  Substance Use Topics  . Smoking status: Not on file  . Smokeless tobacco: Not on file  . Alcohol Use: Not on file    No family history on file.   Review of Systems  Constitutional: Negative.   HENT: Negative.   Eyes: Negative.   Respiratory: Negative.   Cardiovascular: Negative.   Gastrointestinal: Negative.   Genitourinary: Positive for frequency. Negative for dysuria, urgency and hematuria.  Musculoskeletal: Positive for joint pain.  Skin: Negative.   Neurological: Negative.   Endo/Heme/Allergies: Negative.   Psychiatric/Behavioral: Negative.     Objective:  Physical Exam  Constitutional: He is oriented to person, place, and time. He appears well-developed and well-nourished.  HENT:  Head: Normocephalic and atraumatic.  Eyes: EOM are normal. Pupils are  equal, round, and reactive to light.  Neck: Normal range of motion. Neck supple.  Cardiovascular: Normal rate and regular rhythm.  Exam reveals no gallop and no friction rub.   No murmur heard. Respiratory: Effort normal. No respiratory distress. He has no wheezes. He has no rales.  GI: Soft. Bowel sounds are normal.  Musculoskeletal:  Examination of his left knee reveals range of motion from 0 to 110 degrees.  He does have an extension lag.  Moderate patellofemoral crepitus noted.  Some tenderness noted over the pes anserine bursa.  No joint line tenderness.  No swelling.  Positive degree of varus.  No positive straight leg raise.  Negative log roll.  Ligaments are stable.  He is neurovascularly intact distally.    Neurological: He is alert and oriented to person, place, and time.  Skin: Skin is warm and dry.  Psychiatric: He has a normal mood and affect. His behavior is normal. Judgment and thought content normal.    Vital signs in last 24 hours: @VSRANGES @  Labs:   There is no height or weight on file to calculate BMI.   Imaging Review Plain radiographs demonstrate severe degenerative joint disease of the left knee(s). The overall alignment ismild varus. The bone quality appears to be fair for age and reported activity level.  Assessment/Plan:  End stage arthritis, left knee   The patient history, physical examination, clinical judgment of the provider and imaging studies are consistent with end stage degenerative joint disease of the left knee(s) and total knee arthroplasty is deemed medically necessary. The treatment options including medical management, injection therapy arthroscopy and arthroplasty were discussed  at length. The risks and benefits of total knee arthroplasty were presented and reviewed. The risks due to aseptic loosening, infection, stiffness, patella tracking problems, thromboembolic complications and other imponderables were discussed. The patient acknowledged the  explanation, agreed to proceed with the plan and consent was signed. Patient is being admitted for inpatient treatment for surgery, pain control, PT, OT, prophylactic antibiotics, VTE prophylaxis, progressive ambulation and ADL's and discharge planning. The patient is planning to be discharged home with home health services

## 2014-05-31 NOTE — Anesthesia Postprocedure Evaluation (Signed)
  Anesthesia Post-op Note  Patient: Alan Frederick  Procedure(s) Performed: Procedure(s): LEFT TOTAL KNEE ARTHROPLASTY (Left)  Patient Location: PACU  Anesthesia Type:General  Level of Consciousness: awake and alert   Airway and Oxygen Therapy: Patient Spontanous Breathing  Post-op Pain: mild  Post-op Assessment: Post-op Vital signs reviewed, Patient's Cardiovascular Status Stable and Respiratory Function Stable  Post-op Vital Signs: Reviewed  Filed Vitals:   05/31/14 1135  BP:   Pulse: 82  Temp:   Resp: 18    Complications: No apparent anesthesia complications

## 2014-05-31 NOTE — Progress Notes (Signed)
Utilization review completed.  

## 2014-05-31 NOTE — Interval H&P Note (Signed)
History and Physical Interval Note:  05/31/2014 8:31 AM  Alan Frederick  has presented today for surgery, with the diagnosis of DJD LEFT KNEE  The various methods of treatment have been discussed with the patient and family. After consideration of risks, benefits and other options for treatment, the patient has consented to  Procedure(s): LEFT TOTAL KNEE ARTHROPLASTY (Left) as a surgical intervention .  The patient's history has been reviewed, patient examined, no change in status, stable for surgery.  I have reviewed the patient's chart and labs.  Questions were answered to the patient's satisfaction.     [name] F

## 2014-05-31 NOTE — Transfer of Care (Signed)
Immediate Anesthesia Transfer of Care Note  Patient: Alan AbideJohn Frederick  Procedure(s) Performed: Procedure(s): LEFT TOTAL KNEE ARTHROPLASTY (Left)  Patient Location: PACU  Anesthesia Type:GA Level of Consciousness: awake, alert  and oriented  Airway & Oxygen Therapy: Patient Spontanous Breathing and Patient connected to nasal cannula oxygen  Post-op Assessment: Report given to PACU RN  Post vital signs: Reviewed and stable  Complications: No apparent anesthesia complications

## 2014-05-31 NOTE — Anesthesia Preprocedure Evaluation (Addendum)
Anesthesia Evaluation  Patient identified by MRN, date of birth, ID band Patient awake    Reviewed: Allergy & Precautions, H&P , NPO status , Patient's Chart, lab work & pertinent test results  Airway Mallampati: II  TM Distance: >3 FB Neck ROM: Full    Dental no notable dental hx. (+) Teeth Intact, Dental Advisory Given   Pulmonary sleep apnea and Continuous Positive Airway Pressure Ventilation ,  breath sounds clear to auscultation  Pulmonary exam normal       Cardiovascular negative cardio ROS  Rhythm:Regular Rate:Normal     Neuro/Psych negative neurological ROS  negative psych ROS   GI/Hepatic Neg liver ROS, GERD-  Controlled,  Endo/Other  negative endocrine ROS  Renal/GU negative Renal ROS  negative genitourinary   Musculoskeletal   Abdominal   Peds  Hematology negative hematology ROS (+)   Anesthesia Other Findings   Reproductive/Obstetrics negative OB ROS                            Anesthesia Physical Anesthesia Plan  ASA: III  Anesthesia Plan: General   Post-op Pain Management:    Induction: Intravenous  Airway Management Planned: LMA  Additional Equipment:   Intra-op Plan:   Post-operative Plan: Extubation in OR  Informed Consent: I have reviewed the patients History and Physical, chart, labs and discussed the procedure including the risks, benefits and alternatives for the proposed anesthesia with the patient or authorized representative who has indicated his/her understanding and acceptance.   Dental advisory given  Plan Discussed with: CRNA  Anesthesia Plan Comments:         Anesthesia Quick Evaluation

## 2014-06-01 LAB — CBC
HCT: 37.3 % — ABNORMAL LOW (ref 39.0–52.0)
Hemoglobin: 12.7 g/dL — ABNORMAL LOW (ref 13.0–17.0)
MCH: 29.7 pg (ref 26.0–34.0)
MCHC: 34 g/dL (ref 30.0–36.0)
MCV: 87.4 fL (ref 78.0–100.0)
Platelets: 169 10*3/uL (ref 150–400)
RBC: 4.27 MIL/uL (ref 4.22–5.81)
RDW: 13.6 % (ref 11.5–15.5)
WBC: 12.9 10*3/uL — ABNORMAL HIGH (ref 4.0–10.5)

## 2014-06-01 LAB — BASIC METABOLIC PANEL
Anion gap: 11 (ref 5–15)
BUN: 16 mg/dL (ref 6–23)
CALCIUM: 8.5 mg/dL (ref 8.4–10.5)
CO2: 27 mEq/L (ref 19–32)
Chloride: 97 mEq/L (ref 96–112)
Creatinine, Ser: 1.02 mg/dL (ref 0.50–1.35)
GFR, EST AFRICAN AMERICAN: 83 mL/min — AB (ref >90)
GFR, EST NON AFRICAN AMERICAN: 72 mL/min — AB (ref >90)
Glucose, Bld: 124 mg/dL — ABNORMAL HIGH (ref 70–99)
Potassium: 4.2 mEq/L (ref 3.7–5.3)
SODIUM: 135 meq/L — AB (ref 137–147)

## 2014-06-01 NOTE — Progress Notes (Signed)
Physical Therapy Treatment Patient Details Name: Alan Frederick MRN: [id number] DOB: 12/10/1940 Today's Date: 06/01/2014    History of Present Illness pt is a 73 y.o. male s/p Lt TKA, 05/31/14.    PT Comments    Session primary goal to focus on progressing mobility and address stair management technique with pt and wife. Pt at supervision level for gt and min guard for RW management with steps. Answered all questions for pt and wife regarding mobility. Pt undecided as to if he feels "ready" to D/C home today vs. Tomorrow. Will follow per POC while pt in acute setting.   Follow Up Recommendations  Home health PT;Supervision/Assistance - 24 hour     Equipment Recommendations  Other (comment)    Recommendations for Other Services       Precautions / Restrictions Precautions Precautions: Knee Precaution Comments: reinforced no pillow under knee and reviewed wear of KI until quads begin to fire appropriately  Required Braces or Orthoses: Knee Immobilizer - Left Knee Immobilizer - Left: On when out of bed or walking Restrictions Weight Bearing Restrictions: Yes LLE Weight Bearing: Weight bearing as tolerated    Mobility  Bed Mobility Overal bed mobility: Needs Assistance Bed Mobility: Sit to Supine       Sit to supine: Supervision   General bed mobility comments: cues for hook lying technique for pt to use Rt LE to (A) Lt LE    Transfers Overall transfer level: Needs assistance Equipment used: Rolling walker (2 wheeled) Transfers: Sit to/from Stand Sit to Stand: Supervision         General transfer comment: cues for hand placement and safety with transfers   Ambulation/Gait Ambulation/Gait assistance: Supervision Ambulation Distance (Feet): 200 Feet Assistive device: Rolling walker (2 wheeled) Gait Pattern/deviations: Step-through pattern;Decreased step length - right;Decreased stance time - left;Antalgic;Narrow base of support Gait velocity: decreased Gait velocity  interpretation: Below normal speed for age/gender General Gait Details: cues for progressing to equal step through gt and to maintain upright posture; pt with very guarded gt speed initially; progressing with facilitation of RW for normalized gt speed; no c/o lightheadedness this session    Stairs Stairs: Yes Stairs assistance: Min guard Stair Management: No rails;Step to pattern;Backwards;With walker Number of Stairs: 2 General stair comments: multimodal cues for technique to pt and wife; given handout to enforce carryover to home   Wheelchair Mobility    Modified Rankin (Stroke Patients Only)       Balance Overall balance assessment: No apparent balance deficits (not formally assessed) Sitting-balance support: Feet supported;No upper extremity supported Sitting balance-Leahy Scale: Good     Standing balance support: During functional activity Standing balance-Leahy Scale: Fair Standing balance comment: pt able to stand at sink for ADLs without UE support for brief period of time; no LOB noted                    Cognition Arousal/Alertness: Awake/alert Behavior During Therapy: Anxious Overall Cognitive Status: Within Functional Limits for tasks assessed                      Exercises Total Joint Exercises Ankle Circles/Pumps: AROM;Both;15 reps;Seated Quad Sets: AROM;Strengthening;Left;10 reps;Supine;Other (comment) (with foam block) Straight Leg Raises: AROM;Left;Other (comment) (x2; may D/C KI )    General Comments General comments (skin integrity, edema, etc.): discussed D/C plan with pt and questions answered; pt may D/C home today with wife      Pertinent Vitals/Pain Pain Assessment: No/denies pain Pain Location:  Lt knee / quad Pain Descriptors / Indicators: Sore Pain Intervention(s): Monitored during session;Premedicated before session;Repositioned    Home Living                      Prior Function            PT Goals (current  goals can now be found in the care plan section) Acute Rehab PT Goals Patient Stated Goal: to get moving better PT Goal Formulation: With patient Time For Goal Achievement: 06/04/14 Potential to Achieve Goals: Good Progress towards PT goals: Progressing toward goals    Frequency  7X/week    PT Plan Current plan remains appropriate    Co-evaluation             End of Session Equipment Utilized During Treatment: Gait belt;Left knee immobilizer Activity Tolerance: Patient tolerated treatment well Patient left: in bed;with call bell/phone within reach;Other (comment) (in foam block )     Time: [id number] PT Time Calculation (min): 29 min  Charges:  $Gait Training: 23-37 mins                    G Codes:      Donell SievertWest, Chrishauna Mee N, [place]PT  [phone] 06/01/2014, 3:57 PM

## 2014-06-01 NOTE — Progress Notes (Signed)
Subjective: 1 Day Post-Op Procedure(s) (LRB): LEFT TOTAL KNEE ARTHROPLASTY (Left) Patient reports pain as 1 on 0-10 scale.  Patient experiencing mild discomfort.  Jonny RuizJohn has been quite dizzy when sitting up in bed, however this has improved after eating solid food.  No nausea/vomiting.   Positive flatus but no bm.  Tolerating diet.  Objective: Vital signs in last 24 hours: Temp:  [97.1 F (36.2 C)-98.7 F (37.1 C)] 98 F (36.7 C) (10/29 0419) Pulse Rate:  [65-109] 79 (10/29 0419) Resp:  [8-21] 15 (10/29 0419) BP: (115-150)/(66-86) 122/68 mmHg (10/29 0419) SpO2:  [96 %-100 %] 96 % (10/29 0419) Weight:  [77.701 kg (171 lb 4.8 oz)] 77.701 kg (171 lb 4.8 oz) (10/28 [id number])  Intake/Output from previous day: 10/28 0701 - 10/29 0700 In: 1000 [I.V.:1000] Out: 650 [Urine:200; Drains:425; Blood:25] Intake/Output this shift: Total I/O In: -  Out: 150 [Drains:150]  No results found for this basename: HGB,  in the last 72 hours No results found for this basename: WBC, RBC, HCT, PLT,  in the last 72 hours No results found for this basename: NA, K, CL, CO2, BUN, CREATININE, GLUCOSE, CALCIUM,  in the last 72 hours No results found for this basename: LABPT, INR,  in the last 72 hours  Neurologically intact Neurovascular intact Sensation intact distally Intact pulses distally Dorsiflexion/Plantar flexion intact Compartment soft Negative homans bilaterally hemovac drain pulled by me today  Assessment/Plan: 1 Day Post-Op Procedure(s) (LRB): LEFT TOTAL KNEE ARTHROPLASTY (Left) Advance diet Up with therapy Discharge home with home health today or tomorrow pending progress with PT and how patient is feeling afterwards WBAT LLE Will keep fluids one more day secondary to slightly decreased GFR  ANTON, M. LINDSEY 06/01/2014, 6:14 AM

## 2014-06-01 NOTE — Care Management Note (Signed)
CARE MANAGEMENT NOTE 06/01/2014  Patient:  Atlee AbideHOWARD,Xerxes   Account Number:  [id number]  Date Initiated:  06/01/2014  Documentation initiated by:  Vance PeperBRADY,Gala Padovano  Subjective/Objective Assessment:   73 yr old male admitted with DJD of left knee.Patient had a Left total knee arthroplasty.     Action/Plan:   Case manager spoke with patient and wife concerning home health and DME needs. [facility] no changes. patient has support at discharge.   Anticipated DC Date:  06/01/2014   Anticipated DC Plan:  HOME W HOME HEALTH SERVICES      DC Planning Services  CM consult      PAC Choice  DURABLE MEDICAL EQUIPMENT  HOME HEALTH   Choice offered to / List presented to:  C-1 Patient   DME arranged  CPM      DME agency  [employer]     HH arranged  HH-2 PT      [facility]   Status of service:  Completed, signed off Medicare Important Message given?  NA - LOS <3 / Initial given by admissions (If response is "NO", the following Medicare IM given date fields will be blank) Date Medicare IM given:   Medicare IM given by:   Date Additional Medicare IM given:   Additional Medicare IM given by:    Discharge Disposition:  HOME W HOME HEALTH SERVICES  Per UR Regulation:  Reviewed for med. necessity/level of care/duration of stay  If discussed at Long Length of Stay Meetings, dates discussed:    Comments:

## 2014-06-01 NOTE — Evaluation (Signed)
Occupational Therapy Evaluation and Discharge Patient Details Name: Alan AbideJohn Frederick MRN: [id number] DOB: 10/31/1940 Today's Date: 06/01/2014    History of Present Illness pt is a 73 y.o. male s/p Lt TKA, 05/31/14.   Clinical Impression   This 73 yo male admitted and underwent above presents to acute OT with all education completed and no further BADL questions from him or his wife. Acute OT will sign off.    Follow Up Recommendations  No OT follow up    Equipment Recommendations  None recommended by OT       Precautions / Restrictions Precautions Precautions: Fall;Knee Precaution Comments: educated on no pillow under knee  Required Braces or Orthoses: Knee Immobilizer - Left Knee Immobilizer - Left: On when out of bed or walking Restrictions Weight Bearing Restrictions: Yes LLE Weight Bearing: Weight bearing as tolerated      Mobility Bed Mobility Transfers Overall transfer level: Needs assistance Equipment used: Rolling walker (2 wheeled) Transfers: Sit to/from Stand Sit to Stand: Supervision                ADL                                         General ADL Comments: Pt states his wife will A him with LBD and she agrees, I went over the most efficient way to get dressed so as to not get so taxed with this activity. I also demonstrated and pt returned demonstrated shower stall transfer to a seat. He was coming out of the bathroom from the [place] and had no issues with this transfer.               Pertinent Vitals/Pain Pain Assessment: No/denies pain Pain Score: 0-No pain      Hand Dominance Right   Extremity/Trunk Assessment Upper Extremity Assessment Upper Extremity Assessment: Overall WFL for tasks assessed              Cognition Arousal/Alertness: Awake/alert Behavior During Therapy: WFL for tasks assessed/performed Overall Cognitive Status: Within Functional Limits for tasks assessed                   Home Living  Family/patient expects to be discharged to:: Private residence Living Arrangements: Spouse/significant other Available Help at Discharge: Family;Available 24 hours/day Type of Home: House Home Access: Stairs to enter [employer] CorporationEntrance Stairs-Number of Steps: 2 Entrance Stairs-Rails: None Home Layout: One level     Bathroom Shower/Tub: Walk-in shower;Door   Foot LockerBathroom Toilet: Standard     Home Equipment: Shower seat                        OT Goals(Current goals can be found in the care plan section) Acute Rehab OT Goals Patient Stated Goal: home tomorrow  OT Frequency:                End of Session Equipment Utilized During Treatment: Rolling walker;Left knee immobilizer  Activity Tolerance: Patient tolerated treatment well Patient left: in chair;with call bell/phone within reach;with family/visitor present   Time: 0935-1000 OT Time Calculation (min): 25 min Charges:  OT General Charges $OT Visit: 1 Procedure OT Evaluation $Initial OT Evaluation Tier I: 1 Procedure OT Treatments $Self Care/Home Management : 8-22 mins  Alan GeorgesLeonard, Alan Frederick [phone] 06/01/2014, 10:41 AM

## 2014-06-01 NOTE — Progress Notes (Signed)
Physical Therapy Treatment Patient Details Name: Alan AbideJohn Frederick MRN: [id number] DOB: 11/14/1940 Today's Date: 06/01/2014    History of Present Illness pt is a 73 y.o. male s/p Lt TKA, 05/31/14.    PT Comments    Pt very anxious with therapy. Required sitting rest breaks due to lightheadedness. Unable to ambulate in hallway yet. Will benefit from another night in hospital for therapy and to progress mobilization.   Follow Up Recommendations  Home health PT;Supervision/Assistance - 24 hour     Equipment Recommendations  Other (comment) (reports equipment has been delievered)    Recommendations for Other Services OT consult     Precautions / Restrictions Precautions Precautions: Fall;Knee Precaution Comments: educated on no pillow under knee  Required Braces or Orthoses: Knee Immobilizer - Left Knee Immobilizer - Left: On when out of bed or walking Restrictions Weight Bearing Restrictions: Yes LLE Weight Bearing: Weight bearing as tolerated    Mobility  Bed Mobility Overal bed mobility: Needs Assistance Bed Mobility: Supine to Sit     Supine to sit: Supervision;HOB elevated     General bed mobility comments: using UEs to (A) Lt LE off EOB; cues for sequencing   Transfers Overall transfer level: Needs assistance Equipment used: Rolling walker (2 wheeled) Transfers: Sit to/from Stand Sit to Stand: Min guard         General transfer comment: performed sit to stand from bed and chair; min guard to steady; max cues for sequencing due to multiple ? on technique   Ambulation/Gait Ambulation/Gait assistance: Min guard Ambulation Distance (Feet): 20 Feet (10' x 2 ) Assistive device: Rolling walker (2 wheeled) Gait Pattern/deviations: Step-through pattern;Decreased stance time - left;Decreased step length - right;Antalgic;Narrow base of support Gait velocity: decreased Gait velocity interpretation: Below normal speed for age/gender General Gait Details: pt anxious and  requiring max cues for technique; min guard to steady; required sitting rest break due to lightheadedness    Stairs            Wheelchair Mobility    Modified Rankin (Stroke Patients Only)       Balance Overall balance assessment: Needs assistance Sitting-balance support: Feet supported;No upper extremity supported Sitting balance-Leahy Scale: Good Sitting balance - Comments: denied any dizziness; sat EOB ~5 min   Standing balance support: During functional activity;Bilateral upper extremity supported Standing balance-Leahy Scale: Poor Standing balance comment: relies on RW for balance at all times                     Cognition Arousal/Alertness: Awake/alert Behavior During Therapy: Anxious Overall Cognitive Status: Impaired/Different from baseline Area of Impairment: Following commands;Problem solving     Memory: Decreased recall of precautions Following Commands: Follows multi-step commands with increased time     Problem Solving: Slow processing General Comments: pt standing to use restroom and asking "what do i do next?"; unsure if its due to anxiety, pt requiring max cues for simple tasks     Exercises Total Joint Exercises Ankle Circles/Pumps: AROM;Both;Supine;15 reps Quad Sets: AROM;Strengthening;Left;10 reps;Seated Long Arc Quad: AROM;Strengthening;Left;Seated;5 reps;Limitations Con-wayLong Arc Quad Limitations: pain Knee Flexion: AROM;Left;5 reps;Limitations Knee Flexion Limitations: pain Goniometric ROM: -5 to 45 degrees limited by pain    General Comments General comments (skin integrity, edema, etc.): encouarged pt to be up in chair for all meals       Pertinent Vitals/Pain Pain Assessment: 0-10 Pain Score: 2  Pain Location: Lt knee with activity  Pain Descriptors / Indicators: Aching Pain Intervention(s): RN gave pain  meds during session;Repositioned;Monitored during session    Home Living                      Prior Function             PT Goals (current goals can now be found in the care plan section) Acute Rehab PT Goals Patient Stated Goal: home tomorrow when im ready PT Goal Formulation: With patient Time For Goal Achievement: 06/04/14 Potential to Achieve Goals: Good Progress towards PT goals: Progressing toward goals    Frequency  7X/week    PT Plan Current plan remains appropriate    Co-evaluation             End of Session Equipment Utilized During Treatment: Gait belt;Left knee immobilizer Activity Tolerance: Patient tolerated treatment well Patient left: in chair;with call bell/phone within reach     Time: 0754-0821 PT Time Calculation (min): 27 min  Charges:  $Gait Training: 8-22 mins $Therapeutic Exercise: 8-22 mins                    G CodesDonell Sievert:      Aamari Kristyna Bradstreet N , [place]  [phone]  06/01/2014, 8:32 AM

## 2014-06-01 NOTE — Progress Notes (Signed)
Orthopedic Tech Progress Note Patient Details:  Alan Frederick 10/23/1940 [id number]  Patient ID: Alan AbideJohn Frederick, male   DOB: 11/29/1940, 73 y.o.   MRN: [id number] Placed pt's lle in cpm @ 0-40 degrees ;WILL INCREASE AS PT TOLERATES; RN NOTIFIED  Glenette Bookwalter 06/01/2014, 3:11 PM

## 2014-06-02 ENCOUNTER — Encounter (HOSPITAL_COMMUNITY): Payer: Self-pay | Admitting: Orthopedic Surgery

## 2014-11-03 DEATH — deceased

## 2015-06-27 IMAGING — CR DG KNEE 1-2V PORT*L*
2 series · 2 of 2 positions shown · non-contrast
Comparison: None.

CLINICAL DATA: Total knee replacement status.

EXAM:
PORTABLE LEFT KNEE - 1-2 VIEW

[AP]
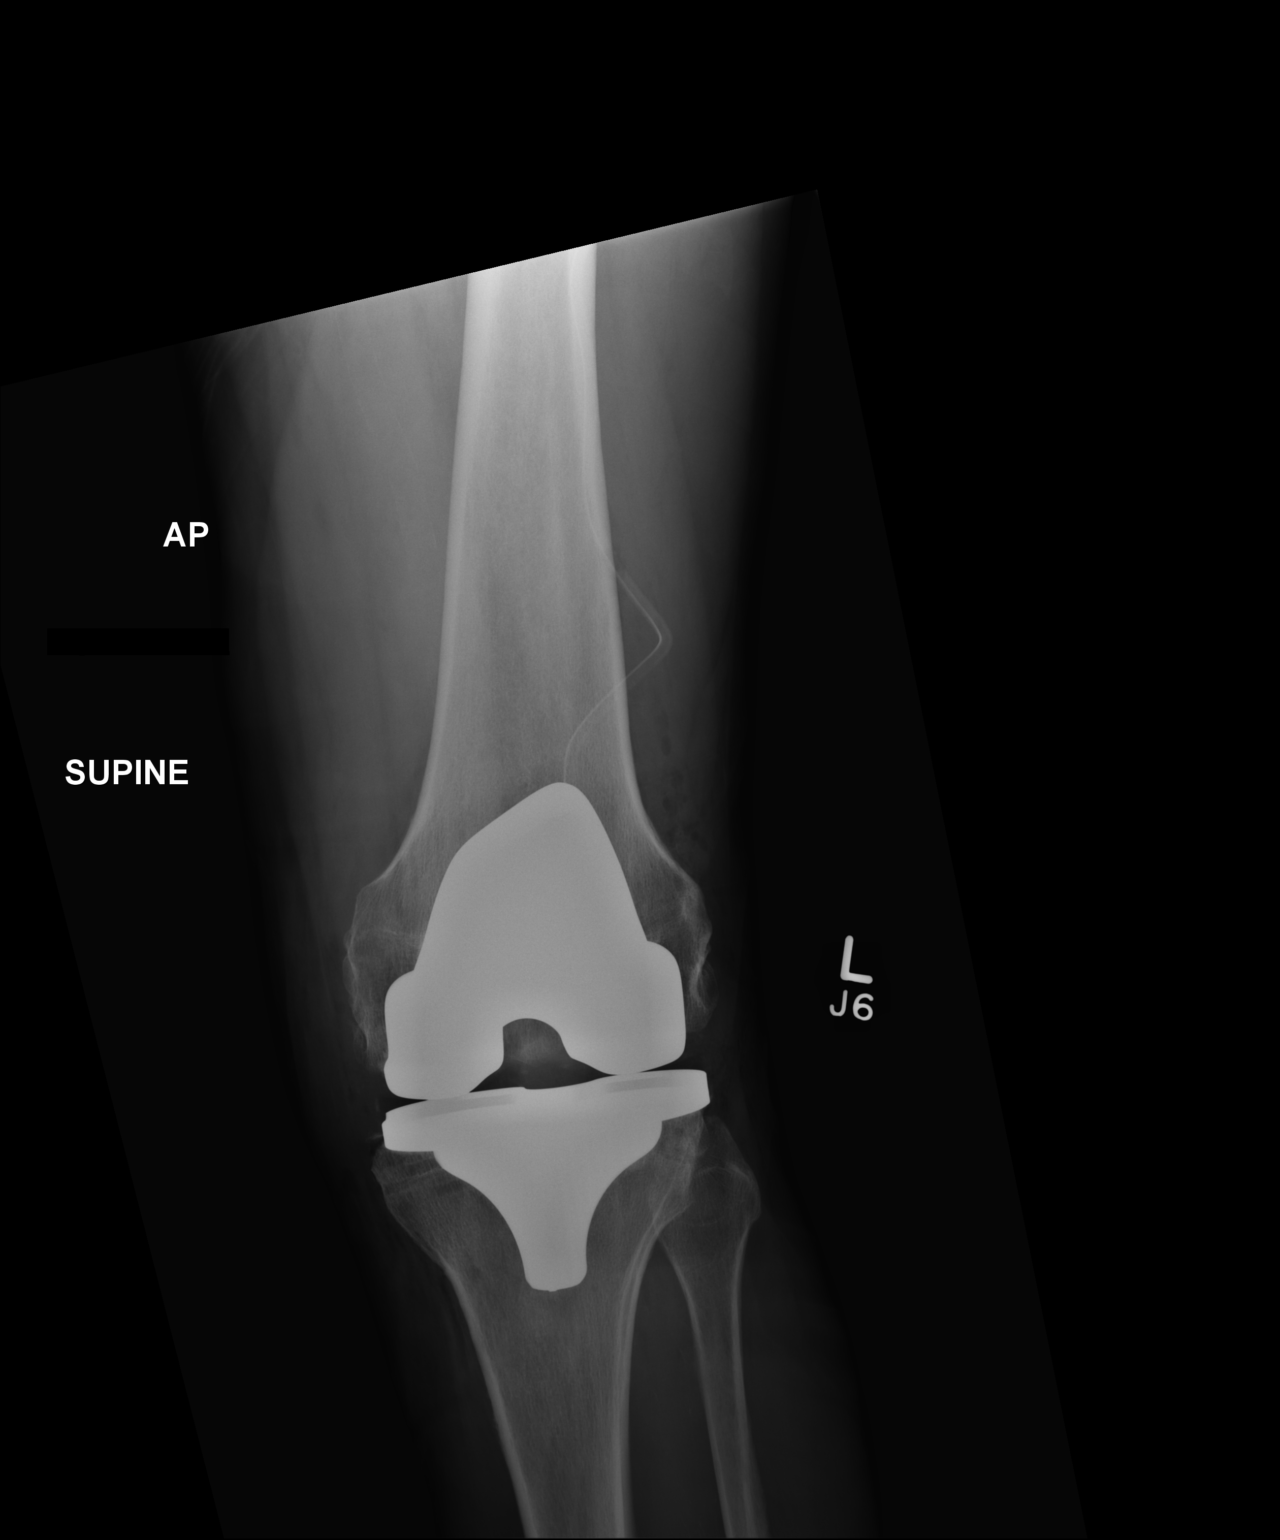

[xtable lateral]
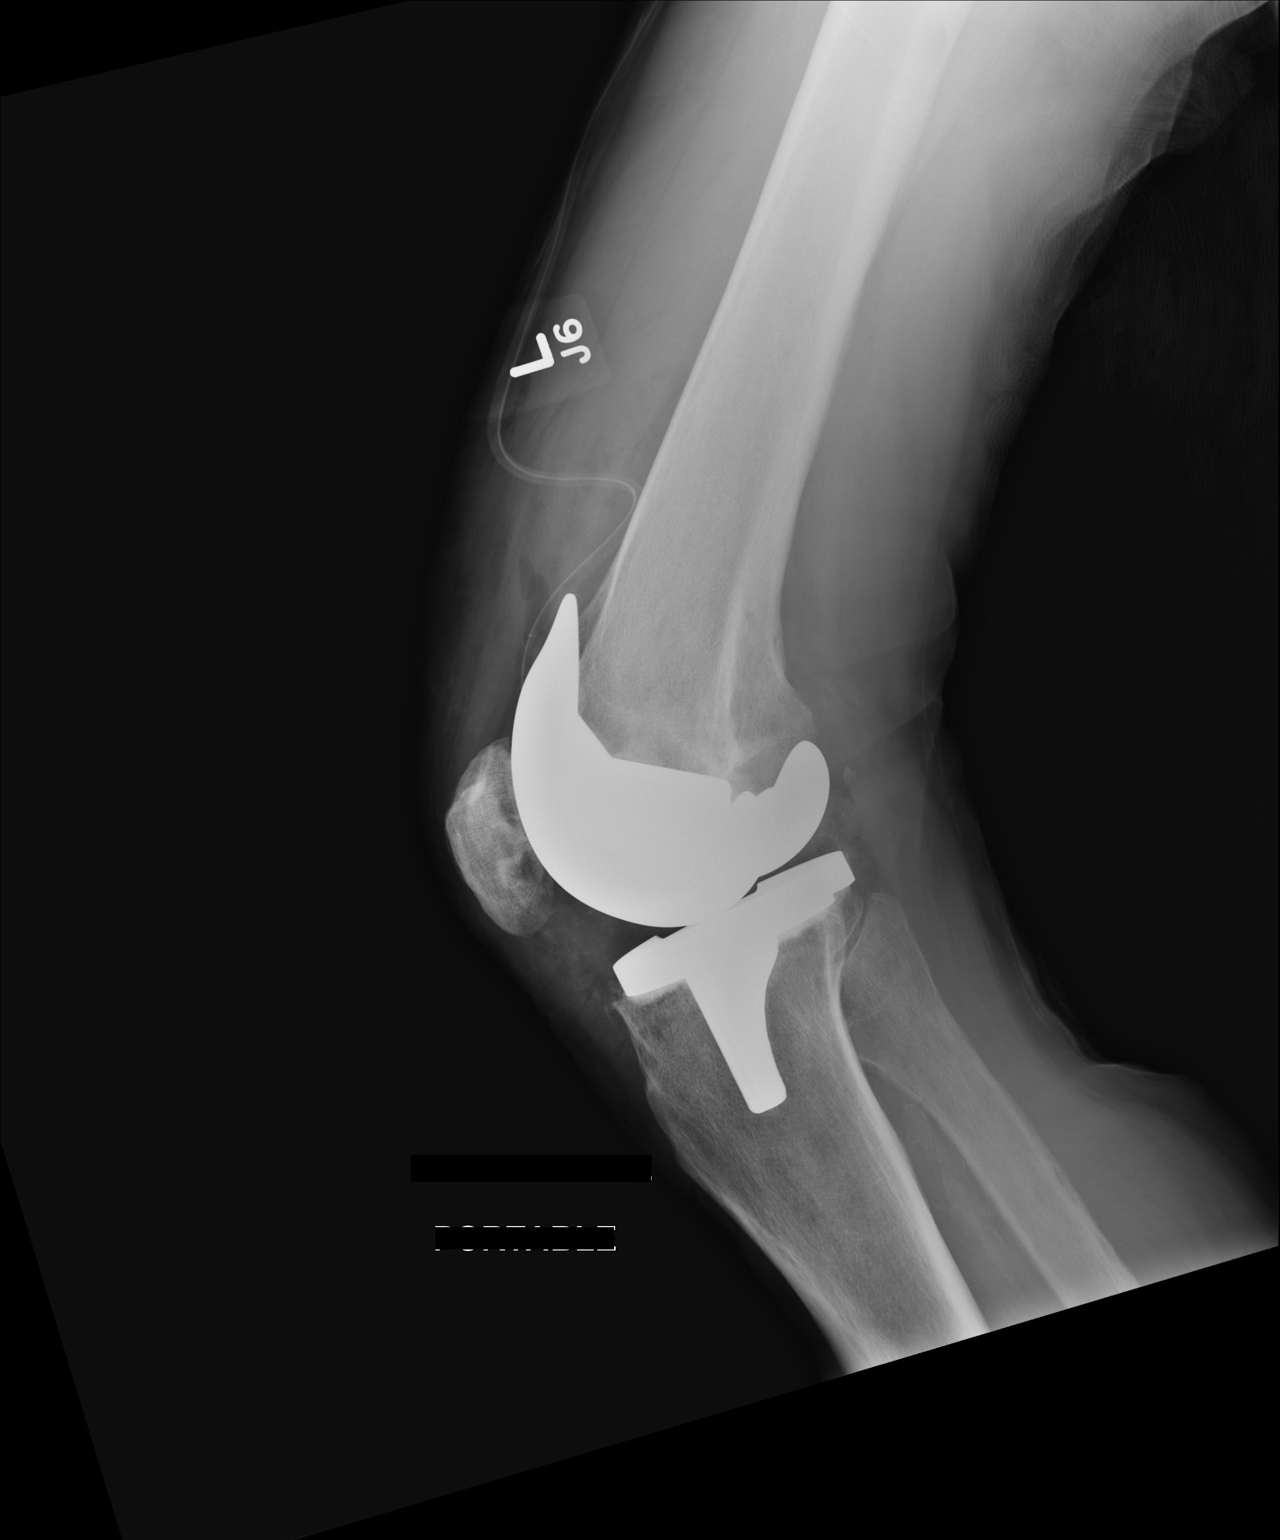

[2 of 2 positions shown; findings below may reference images not displayed]

FINDINGS: Patient is status post left total knee arthroplasty. Surgical drain
is in place. Subcutaneous and joint air/fluid are present.
IMPRESSION: Left total knee arthroplasty with expected postoperative findings.
